# Patient Record
Sex: Female | Born: 1980 | Race: Black or African American | Hispanic: No | Marital: Single | Smoking: Former smoker
Health system: Southern US, Community
[De-identification: ages and names within clinical notes are randomized; demographics above are authoritative.]

## PROBLEM LIST (undated history)

## (undated) DIAGNOSIS — N83209 Unspecified ovarian cyst, unspecified side: Secondary | ICD-10-CM

## (undated) HISTORY — PX: BREAST SURGERY: SHX581

## (undated) HISTORY — PX: KNEE ARTHROSCOPY WITH ANTERIOR CRUCIATE LIGAMENT (ACL) REPAIR: SHX5644

## (undated) HISTORY — PX: BREAST CYST EXCISION: SHX579

## (undated) HISTORY — DX: Unspecified ovarian cyst, unspecified side: N83.209

## (undated) HISTORY — PX: OOPHORECTOMY: SHX86

---

## 2001-05-11 ENCOUNTER — Other Ambulatory Visit: Admission: RE | Admit: 2001-05-11 | Discharge: 2001-05-11 | Payer: Self-pay | Admitting: Obstetrics and Gynecology

## 2001-12-28 ENCOUNTER — Inpatient Hospital Stay (HOSPITAL_COMMUNITY): Admission: AD | Admit: 2001-12-28 | Discharge: 2001-12-30 | Payer: Self-pay | Admitting: Obstetrics and Gynecology

## 2002-09-21 ENCOUNTER — Emergency Department (HOSPITAL_COMMUNITY): Admission: EM | Admit: 2002-09-21 | Discharge: 2002-09-21 | Payer: Self-pay | Admitting: Emergency Medicine

## 2002-09-22 ENCOUNTER — Emergency Department (HOSPITAL_COMMUNITY): Admission: EM | Admit: 2002-09-22 | Discharge: 2002-09-22 | Payer: Self-pay | Admitting: Internal Medicine

## 2002-09-27 ENCOUNTER — Emergency Department (HOSPITAL_COMMUNITY): Admission: EM | Admit: 2002-09-27 | Discharge: 2002-09-27 | Payer: Self-pay | Admitting: Emergency Medicine

## 2006-04-04 ENCOUNTER — Emergency Department (HOSPITAL_COMMUNITY): Admission: EM | Admit: 2006-04-04 | Discharge: 2006-04-04 | Payer: Self-pay | Admitting: Emergency Medicine

## 2006-09-01 ENCOUNTER — Inpatient Hospital Stay (HOSPITAL_COMMUNITY): Admission: AD | Admit: 2006-09-01 | Discharge: 2006-09-01 | Payer: Self-pay | Admitting: Family Medicine

## 2007-05-08 ENCOUNTER — Ambulatory Visit (HOSPITAL_COMMUNITY): Admission: RE | Admit: 2007-05-08 | Discharge: 2007-05-08 | Payer: Self-pay | Admitting: Obstetrics and Gynecology

## 2007-05-08 ENCOUNTER — Encounter (INDEPENDENT_AMBULATORY_CARE_PROVIDER_SITE_OTHER): Payer: Self-pay | Admitting: Obstetrics and Gynecology

## 2007-07-01 ENCOUNTER — Emergency Department (HOSPITAL_COMMUNITY): Admission: EM | Admit: 2007-07-01 | Discharge: 2007-07-01 | Payer: Self-pay | Admitting: Emergency Medicine

## 2007-09-28 ENCOUNTER — Ambulatory Visit (HOSPITAL_COMMUNITY): Admission: RE | Admit: 2007-09-28 | Discharge: 2007-09-28 | Payer: Self-pay | Admitting: Family Medicine

## 2007-11-28 ENCOUNTER — Other Ambulatory Visit: Admission: RE | Admit: 2007-11-28 | Discharge: 2007-11-28 | Payer: Self-pay | Admitting: Obstetrics and Gynecology

## 2008-04-25 ENCOUNTER — Inpatient Hospital Stay (HOSPITAL_COMMUNITY): Admission: AD | Admit: 2008-04-25 | Discharge: 2008-04-26 | Payer: Self-pay | Admitting: Obstetrics & Gynecology

## 2008-04-25 ENCOUNTER — Ambulatory Visit: Payer: Self-pay | Admitting: Family Medicine

## 2010-02-01 ENCOUNTER — Emergency Department (HOSPITAL_COMMUNITY): Admission: EM | Admit: 2010-02-01 | Discharge: 2010-02-01 | Payer: Self-pay | Admitting: Emergency Medicine

## 2010-07-22 LAB — STREP A DNA PROBE: Group A Strep Probe: NEGATIVE

## 2010-07-22 LAB — RAPID STREP SCREEN (MED CTR MEBANE ONLY): Streptococcus, Group A Screen (Direct): NEGATIVE

## 2010-09-21 NOTE — Op Note (Signed)
Vanessa Stevens, Vanessa Stevens             ACCOUNT NO.:  [id number]   MEDICAL RECORD NO.:  [id number]          PATIENT TYPE:  AMB   LOCATION:  SDC                           FACILITY:  [facility]   PHYSICIAN:  Lenoard Aden, M.D.DATE OF BIRTH:  [birth year]   DATE OF PROCEDURE:  05/08/2007  DATE OF DISCHARGE:                               OPERATIVE REPORT   PREOPERATIVE DIAGNOSIS:  Left ovarian cyst.   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   PROCEDURE:  1. Diagnostic laparoscopy.  2. Left salpingo-oophorectomy.   SURGEON:  Dr. Rupert Stacks   ASSISTANT:  Sirven   ANESTHESIA:  General.   ESTIMATED BLOOD LOSS:  Less than 50 mL.   COMPLICATIONS:  None.   DRAINS:  Foley.   COUNTS:  Counts correct.   DISPOSITION:  Patient to recovery in good condition.   PROCEDURE:  After being apprised of risks of anesthesia, infection,  bleeding, need for repair, delayed risks of complications to include  bowel and bladder injury, the patient was brought to the operating room  where she was administered a general anesthetic without complications,  prepped and draped in the usual sterile fashion.  Foley catheter placed.  Hulka tenaculum placed per vagina.  Infraumbilical incision made with a  scalpel.  Fascia was identified and opened transversely, a pursestring  suture placed.  Peritoneum entered bluntly.  Hasson trocar placed.  Visualization reveals a large left ovarian mass that was approximately 8  cm in size.  No excrescences noted. At this time, a 5-mm trocar site in  the left lower quadrant under direct visualization and a 10-mm on the  right under direct visualization.  Instruments were placed.  Infundibulopelvic ligament was identified on the left. Ureter was  identified, and the infundibulopelvic ligament was cauterized, and the  specimen is detached taking progressive bites down the mesosalpinx  including the left tube and ovary, with good hemostasis noted.   At this  time we attempted to place the large specimen in an EndoCatch which was  unsuccessful due to the size of the mass.  Therefore, a new shot is  entered with monopolar cautery.  A hole was created revealing copious  amounts of sebaceous type material and hair which was suctioned  aggressively using a large suction and the shocks. At this time, the  cyst, being decompressed, is then placed in an EndoCatch and brought up  to the surface whereby additional hair and sebaceous material was  removed, decompressing it and pulling it out through the infraumbilical  port without difficulty.  Specimen was then sent to Pathology for  permanent section.  Irrigation was accomplished copiously with three  liters of fluid placed for a copious amount of irrigation without  difficulty.  Good hemostasis was noted.  Pictures were taken.  Normal  right tube and ovary are confirmed and normal uterus.  The area of left  salpingo-oophorectomy which has been removed appears hemostatic.  Irrigation fluid was suctioned aggressively, and good hemostasis was  noted.  No evidence of remaining sebaceous material and hair is noted at  this time. All incisions  were removed under direct visualization, CO2  having been released.  Incisions were closed using 4-0 Vicryl, 0-Vicryl  and Dermabond.  The patient tolerated this procedure well, transferred  to recovery in good condition.      Lenoard Aden, M.D.  Electronically Signed     RJT/MEDQ  D:  05/08/2007  T:  05/09/2007  Job:  [id number]

## 2010-09-24 NOTE — H&P (Signed)
   NAME:  Vanessa Stevens, Vanessa Stevens                       ACCOUNT NO.:  [id number]   MEDICAL RECORD NO.:  [id number]                   PATIENT TYPE:  INP   LOCATION:  A428                                 FACILITY:  [facility]   PHYSICIAN:  [name], CNM       DATE OF BIRTH:  November 03, 1980   DATE OF ADMISSION:  12/28/2001  DATE OF DISCHARGE:                                HISTORY & PHYSICAL   CHIEF COMPLAINT:  Regular uterine contractions since 0900.   HISTORY OF PRESENT ILLNESS:  The patient is a 30 year old gravida 1, para 0  with an EDC of January 01, 2002 based on a sure last menstrual period and  corresponding first and second trimester ultrasounds.  She began her  prenatal care early in the first trimester and has had regular visits  throughout.   PRENATAL LABORATORIES:  Blood type B+.  Rubella immune.  HBSAG, HIV, RPR,  gonorrhea, Chlamydia, group beta Strep, and MSAFP are all negative.  Although her prenatal records at the hospital states sickle positive, when  reviewing her chart at the office it is actually negative with a negative  hemoglobin electrophoresis.  One hour GTT was normal at 106.  Blood  pressures have been 110s-120s/60s-80s.  There has been appropriate fundal  height growth.   PAST MEDICAL HISTORY:  Noncontributory.   PAST SURGICAL HISTORY:  Torn ACL in 1999, breast biopsy 2001 that was  benign.  No history of blood transfusions or anesthesia complications.   ALLERGIES:  No known drug allergies.   MEDICATIONS:  Prenatal vitamins and iron.   SOCIAL HISTORY:  Denies cigarettes, alcohol, or recreational drug use during  pregnancy.  Drug screens were negative.   FAMILY HISTORY:  Noncontributory.   PAST OBSTETRIC HISTORY:  Gravida 1.   PHYSICAL EXAMINATION:  HEENT:  Within normal limits.  HEART:  Regular rate and rhythm.  LUNGS:  Clear to auscultation.  ABDOMEN:  Soft, nontender.  Having moderate to palpation uterine  contractions every two to three  minutes.  PELVIC:  Cervix is now 5, 100%, -1 station, vertex presentation which is a  change from initial examination on OB of 2, 90, -1.  EXTREMITIES:  Leg:  Trace edema.   IMPRESSION:  Intrauterine pregnancy at 39-1/2 weeks, active labor.   PLAN:  AROM reveals clear fluid.  Fetal heart rate is reactive without  decelerations.  The patient is tolerating labor very well at this time.  Will perhaps proceed to IV analgesia or epidural anesthesia as necessary.                                               [name], CNM    FC/MEDQ  D:  12/28/2001  T:  12/28/2001  Job:  [id number]   cc:   [facility]

## 2010-09-24 NOTE — Op Note (Signed)
   NAME:  DIAHANN, GUAJARDO                       ACCOUNT NO.:  [id number]   MEDICAL RECORD NO.:  [id number]                   PATIENT TYPE:  INP   LOCATION:  A428                                 FACILITY:  [facility]   PHYSICIAN:  Tilda Burrow, M.D.              DATE OF BIRTH:  1981/02/25   DATE OF PROCEDURE:  DATE OF DISCHARGE:                                 OPERATIVE REPORT   PROCEDURE:  Epidural catheter placement.   SURGEON:  Tilda Burrow, M.D.   DESCRIPTION OF PROCEDURE:  The patient requested an epidural and was given a  fluid bolus shortly before 8 p.m.  At 8 p.m. a continuous lumbar epidural  was instilled through L2-3 interspace on first attempt using loss of  resistance technique and easily identifying the epidural space at  approximately 6 cm depth.  A 5 cc bolus of 1.5% Xylocaine with epinephrine  was infused followed by insertion of the epidural catheter to 3 cm into the  epidural space with it taped in place.  The 5 cc bolus of 0.125% Marcaine  with 2 mcg/mL of Fentanyl was then infused and 12 cc per hour, continuous  infusion following.  Initial dermatome level was 212. The patient had  excellent symmetric analgesic relief and is now on Pitocin augmentation.                                                Tilda Burrow, M.D.    JVF/MEDQ  D:  12/28/2001  T:  12/29/2001  Job:  [id number]

## 2010-09-24 NOTE — Op Note (Signed)
NAME:  Vanessa Stevens, Vanessa Stevens                       ACCOUNT NO.:  [id number]   MEDICAL RECORD NO.:  [id number]                   PATIENT TYPE:  INP   LOCATION:  A428                                 FACILITY:  [facility]   PHYSICIAN:  Tilda Burrow, M.D.              DATE OF BIRTH:  [birth year]   DATE OF PROCEDURE:  12/28/2001  DATE OF DISCHARGE:                                 OPERATIVE REPORT   LABOR SUMMARY DELIVERY NOTE:   DESCRIPTION OF PROCEDURE:  This patient was admitted shortly after midnight,  having as described in the admitting history by [name].  She progressed slowly through the day, tolerating labor well, and reached  approximately 5 cm at midday.  She was begun on Pitocin augmentation at  approximately 4 a.m., when she had slowed in per progress.  The protracted  labor was improved by the use of Pitocin.   The cervix was 6 cm, 100%, minus 1 at 5:10 p.m. I assumed primary  responsibility, thereafter, and she progressed to approximately 7 cm, 100%  effaced, minus 1 to minus 2 station at the time of epidural placement at 8  p.m.   EPIDURAL PLACEMENT:  Epidural was placed using loss of resistance technique  as described in the epidural catheter note dictated earlier this evening.   We then proceeded with Pitocin augmentation and she progressed slowly to  completely dilated at shortly after 11 p.m.  She pushed through a brief  second-stage that was notable for repetitive variable decelerations which  got worse.  They were actually occurring late in a late position on the  monitoring strip, but were accompanied by excellent beat-to-beat variability  and shoulders consistent with variables.  She progressed to the spontaneous  expulsion over an intact perineum from the direct OA position of a healthy 7-  pound 13.8 ounce infant, female, Apgars 8 and 9 delivered over a deep,  fragmented, second-degree laceration.   The delivery was straightforward.  There was  nuchal arm x1 and the infant's  posterior arm was released initially and then the anterior shoulder  delivered easily thereafter.  Bulb suctioning was performed of the nose and  pharynx and then the patient progressed to expel the rest of the baby the  rest of the way with ease.  She then proceeded to have cord blood samples  obtained, Tomasa Blase presentation delivery of the placenta, which was grossly  normal, 3-vessel cord confirmed.   The repair of the perineum was somewhat challenging due to the 2-sided  laceration which left an intact band of tissue right over the perineal body.  A series of horizontal mattress sutures reaching from just beneath the  intact epithelium on each side, extending down underneath the central band  of intact perineum and up to the other side where a second horizontal  mattress was used.  This allowed for reasonable tissue approximation.  The  patient had  only epidural  analgesia for this and had some sensation at the time of  repair.  Epidural catheter was then removed and the tip visualized as  intact.   The patient tolerated the procedure well, and was well supported by three  family members.                                               Tilda Burrow, M.D.    JVF/MEDQ  D:  12/28/2001  T:  12/30/2001  Job:  [id number]

## 2011-02-02 LAB — HCG, QUANTITATIVE, PREGNANCY: hCG, Beta Chain, Quant, S: 45343 — ABNORMAL HIGH

## 2011-02-11 LAB — CBC
Hemoglobin: 9.9 g/dL — ABNORMAL LOW (ref 12.0–15.0)
Hemoglobin: 12.4
Platelets: 225 10*3/uL (ref 150–400)
Platelets: 252
RDW: 14.9 % (ref 11.5–15.5)
RDW: 14

## 2011-02-11 LAB — HEPATITIS B SURFACE ANTIGEN: Hepatitis B Surface Ag: NEGATIVE

## 2011-02-11 LAB — HEPATITIS B SURFACE ANTIBODY,QUALITATIVE: Hep B S Ab: POSITIVE — AB

## 2011-02-11 LAB — HCG, SERUM, QUALITATIVE: Preg, Serum: NEGATIVE

## 2011-02-11 LAB — RPR: RPR Ser Ql: NONREACTIVE

## 2012-11-15 ENCOUNTER — Emergency Department (HOSPITAL_COMMUNITY)
Admission: EM | Admit: 2012-11-15 | Discharge: 2012-11-15 | Disposition: A | Discharge status: Home / Self Care | Payer: Self-pay | Attending: Emergency Medicine | Admitting: Emergency Medicine

## 2012-11-15 ENCOUNTER — Emergency Department (HOSPITAL_COMMUNITY): Payer: Self-pay

## 2012-11-15 ENCOUNTER — Encounter (HOSPITAL_COMMUNITY): Payer: Self-pay | Admitting: Emergency Medicine

## 2012-11-15 DIAGNOSIS — K921 Melena: Secondary | ICD-10-CM | POA: Insufficient documentation

## 2012-11-15 DIAGNOSIS — R112 Nausea with vomiting, unspecified: Secondary | ICD-10-CM | POA: Insufficient documentation

## 2012-11-15 DIAGNOSIS — R197 Diarrhea, unspecified: Secondary | ICD-10-CM | POA: Insufficient documentation

## 2012-11-15 DIAGNOSIS — K5289 Other specified noninfective gastroenteritis and colitis: Secondary | ICD-10-CM | POA: Insufficient documentation

## 2012-11-15 DIAGNOSIS — R6883 Chills (without fever): Secondary | ICD-10-CM | POA: Insufficient documentation

## 2012-11-15 DIAGNOSIS — F17200 Nicotine dependence, unspecified, uncomplicated: Secondary | ICD-10-CM | POA: Insufficient documentation

## 2012-11-15 DIAGNOSIS — Z3202 Encounter for pregnancy test, result negative: Secondary | ICD-10-CM | POA: Insufficient documentation

## 2012-11-15 DIAGNOSIS — K529 Noninfective gastroenteritis and colitis, unspecified: Secondary | ICD-10-CM

## 2012-11-15 LAB — BASIC METABOLIC PANEL
CO2: 27 mEq/L (ref 19–32)
Glucose, Bld: 110 mg/dL — ABNORMAL HIGH (ref 70–99)
Potassium: 3.7 mEq/L (ref 3.5–5.1)
Sodium: 138 mEq/L (ref 135–145)

## 2012-11-15 LAB — CBC WITH DIFFERENTIAL/PLATELET
Basophils Absolute: 0 10*3/uL (ref 0.0–0.1)
Lymphocytes Relative: 11 % — ABNORMAL LOW (ref 12–46)
Lymphs Abs: 1 10*3/uL (ref 0.7–4.0)
Neutro Abs: 7.7 10*3/uL (ref 1.7–7.7)
Neutrophils Relative %: 83 % — ABNORMAL HIGH (ref 43–77)
Platelets: 240 10*3/uL (ref 150–400)
RBC: 4.27 MIL/uL (ref 3.87–5.11)
WBC: 9.2 10*3/uL (ref 4.0–10.5)

## 2012-11-15 LAB — URINE MICROSCOPIC-ADD ON

## 2012-11-15 LAB — HEPATIC FUNCTION PANEL
ALT: 12 U/L (ref 0–35)
Bilirubin, Direct: <0.1 mg/dL (ref 0.0–0.3)
Total Protein: 8 g/dL (ref 6.0–8.3)

## 2012-11-15 LAB — URINALYSIS, ROUTINE W REFLEX MICROSCOPIC
Glucose, UA: NEGATIVE mg/dL
Leukocytes, UA: NEGATIVE
Specific Gravity, Urine: >1.03 — ABNORMAL HIGH (ref 1.005–1.030)
Urobilinogen, UA: 0.2 mg/dL (ref 0.0–1.0)

## 2012-11-15 LAB — PREGNANCY, URINE: Preg Test, Ur: NEGATIVE

## 2012-11-15 MED ORDER — LOPERAMIDE HCL 2 MG PO TABS: 2.0000 mg | ORAL_TABLET | Freq: Four times a day (QID) | ORAL | Status: DC | PRN

## 2012-11-15 MED ORDER — IOHEXOL 300 MG/ML  SOLN
50.0000 mL | Freq: Once | INTRAMUSCULAR | Status: AC | PRN
  Administered 2012-11-15: 50 mL via ORAL

## 2012-11-15 MED ORDER — HYDROMORPHONE HCL PF 1 MG/ML IJ SOLN
1.0000 mg | Freq: Once | INTRAMUSCULAR | Status: AC
  Administered 2012-11-15: 1 mg via INTRAVENOUS
  Filled 2012-11-15: qty 1

## 2012-11-15 MED ORDER — SODIUM CHLORIDE 0.9 % IV SOLN
INTRAVENOUS | Status: DC
  Administered 2012-11-15: 12:00:00 via INTRAVENOUS

## 2012-11-15 MED ORDER — HYDROCODONE-ACETAMINOPHEN 5-325 MG PO TABS: 1.0000 | ORAL_TABLET | Freq: Four times a day (QID) | ORAL | Status: DC | PRN

## 2012-11-15 MED ORDER — ONDANSETRON HCL 4 MG/2ML IJ SOLN
4.0000 mg | Freq: Once | INTRAMUSCULAR | Status: AC
  Administered 2012-11-15: 4 mg via INTRAVENOUS
  Filled 2012-11-15: qty 2

## 2012-11-15 MED ORDER — SODIUM CHLORIDE 0.9 % IV BOLUS (SEPSIS)
1000.0000 mL | Freq: Once | INTRAVENOUS | Status: AC
  Administered 2012-11-15: 1000 mL via INTRAVENOUS

## 2012-11-15 MED ORDER — IOHEXOL 300 MG/ML  SOLN
100.0000 mL | Freq: Once | INTRAMUSCULAR | Status: AC | PRN
  Administered 2012-11-15: 100 mL via INTRAVENOUS

## 2012-11-15 MED ORDER — ONDANSETRON 4 MG PO TBDP: 4.0000 mg | ORAL_TABLET | Freq: Three times a day (TID) | ORAL | Status: DC | PRN

## 2012-11-15 MED ORDER — PROMETHAZINE HCL 25 MG PO TABS: 25.0000 mg | ORAL_TABLET | Freq: Four times a day (QID) | ORAL | Status: DC | PRN

## 2012-11-15 NOTE — ED Provider Notes (Signed)
History  This chart was scribed for Vanessa Jakes, MD by Bennett Scrape, ED Scribe. This patient was seen in room APA09/APA09 and the patient's care was started at 10:01 AM.  CSN: [id number]  Arrival date & time 11/15/12  0908   First MD Initiated Contact with Patient 11/15/12 1001     Chief Complaint  Patient presents with  . Abdominal Pain    Patient is a 32 y.o. female presenting with abdominal pain. The history is provided by the patient. No language interpreter was used.  Abdominal Pain This is a new problem. The current episode started 2 days ago. Episode frequency: intermittently. The problem has not changed since onset.Associated symptoms include abdominal pain. Pertinent negatives include no chest pain, no headaches and no shortness of breath. Nothing aggravates the symptoms. Nothing relieves the symptoms. She has tried nothing for the symptoms.    HPI Comments: Vanessa Stevens is a 32 y.o. female who presents to the Emergency Department complaining of 2 days of intermittent periumbilical abdominal pain described as cramping with associated nausea, emesis and diarrhea. She reports 3 episodes of emesis today with no episodes occuring yesterday or 2 days ago. She states that she has experienced 4 episodes of diarrhea today usually with emesis and more than 4 episodes yesterday. She states that the pain radiates to her back and she rates her pain an 8 out of 10. She reports that the symptoms are similar to pain experienced with her menses but is more severe and in a different location. She states that she is due to start her menses soon stating LNMP was June 15th, 2014. She denies vaginal bleeding or discharge, CP, dysuria, hematuria and leg swelling as associated symptoms. Pt does not have a h/o chronic medical conditions and is a current everyday smoker but denies alcohol use.Marland Kitchen  History reviewed. No pertinent past medical history.  Past Surgical History  Procedure Laterality  Date  . Oophorectomy    . Breast surgery    . Knee arthroscopy with anterior cruciate ligament (acl) repair     No family history on file. History  Substance Use Topics  . Smoking status: Current Every Day Smoker -- 0.50 packs/day    Types: Cigarettes  . Smokeless tobacco: Not on file  . Alcohol Use: No   No OB history provided.  Review of Systems  Constitutional: Positive for chills. Negative for fever.  HENT: Negative for congestion, sore throat and neck pain.   Eyes: Negative for visual disturbance.  Respiratory: Negative for cough and shortness of breath.   Cardiovascular: Negative for chest pain and leg swelling.  Gastrointestinal: Positive for nausea, vomiting, abdominal pain, diarrhea and blood in stool.  Genitourinary: Negative for dysuria.  Musculoskeletal: Negative for back pain.  Skin: Negative for rash.  Neurological: Negative for headaches.  Hematological: Does not bruise/bleed easily.  Psychiatric/Behavioral: Negative for confusion.    Allergies  Review of patient's allergies indicates no known allergies.  Home Medications   Current Outpatient Rx  Name  Route  Sig  Dispense  Refill  . HYDROcodone-acetaminophen (NORCO/VICODIN) 5-325 MG per tablet   Oral   Take 1-2 tablets by mouth every 6 (six) hours as needed for pain.   15 tablet   0   . loperamide (IMODIUM A-D) 2 MG tablet   Oral   Take 1 tablet (2 mg total) by mouth 4 (four) times daily as needed for diarrhea or loose stools.   30 tablet   0   .  ondansetron (ZOFRAN ODT) 4 MG disintegrating tablet   Oral   Take 1 tablet (4 mg total) by mouth every 8 (eight) hours as needed.   10 tablet   0   . promethazine (PHENERGAN) 25 MG tablet   Oral   Take 1 tablet (25 mg total) by mouth every 6 (six) hours as needed for nausea.   12 tablet   0     Triage Vitals: BP 128/80  Pulse 87  Temp(Src) 98.1 F (36.7 C)  Resp 18  Ht 5\' 3"  (1.6 m)  Wt 190 lb (86.183 kg)  BMI 33.67 kg/m2  SpO2 98%  LMP  10/21/2012  Physical Exam  Nursing note and vitals reviewed. Constitutional: She is oriented to person, place, and time. She appears well-developed and well-nourished. No distress.  HENT:  Head: Normocephalic and atraumatic.  Mouth/Throat: Oropharynx is clear and moist.  Eyes: Conjunctivae and EOM are normal. Pupils are equal, round, and reactive to light.  Sclera are clear  Neck: Neck supple. No tracheal deviation present.  Cardiovascular: Normal rate and regular rhythm.   No murmur heard. Pulses:      Dorsalis pedis pulses are 2+ on the right side, and 2+ on the left side.  Pulmonary/Chest: Effort normal and breath sounds normal. No respiratory distress. She has no wheezes.  Abdominal: Soft. She exhibits no distension. Bowel sounds are decreased. There is tenderness in the epigastric area and periumbilical area.  Musculoskeletal: Normal range of motion. She exhibits no edema (no ankle swelling).  Lymphadenopathy:    She has no cervical adenopathy.  Neurological: She is alert and oriented to person, place, and time. No cranial nerve deficit.  Pt able to move both sets of fingers and toes  Skin: Skin is warm and dry. No rash noted.  Psychiatric: She has a normal mood and affect. Her behavior is normal.    ED Course  Procedures (including critical care time)  DIAGNOSTIC STUDIES: Oxygen Saturation is 98% on room air, normal by my interpretation.    COORDINATION OF CARE: 10:56 AM-Discussed treatment plan which includes CT of abdomen, CBC panel, BMP and UA with pt at bedside and pt agreed to plan.   Labs Reviewed  CBC WITH DIFFERENTIAL - Abnormal; Notable for the following:    Neutrophils Relative % 83 (*)    Lymphocytes Relative 11 (*)    All other components within normal limits  BASIC METABOLIC PANEL - Abnormal; Notable for the following:    Glucose, Bld 110 (*)    All other components within normal limits  URINALYSIS, ROUTINE W REFLEX MICROSCOPIC - Abnormal; Notable for the  following:    Specific Gravity, Urine >1.030 (*)    Hgb urine dipstick SMALL (*)    Ketones, ur 40 (*)    All other components within normal limits  URINE MICROSCOPIC-ADD ON - Abnormal; Notable for the following:    Squamous Epithelial / LPF MANY (*)    Bacteria, UA MANY (*)    All other components within normal limits  PREGNANCY, URINE  HEPATIC FUNCTION PANEL  LIPASE, BLOOD   Ct Abdomen Pelvis W Contrast  11/15/2012   *RADIOLOGY REPORT*  Clinical Data: Periumbilical abdominal pain for 2 days, nausea, vomiting, diarrhea, pain radiating to back, history smoking  CT ABDOMEN AND PELVIS WITH CONTRAST  Technique:  Multidetector CT imaging of the abdomen and pelvis was performed following the standard protocol during bolus administration of intravenous contrast. Sagittal and coronal MPR images reconstructed from axial data set.  Contrast: 50mL OMNIPAQUE IOHEXOL 300 MG/ML  SOLN, OMNIPAQUE IOHEXOL 300 MG/ML  SOLN  Comparison: None.  Findings: Lung bases clear. Five non-rib bearing lumbar vertebrae. Upper normal-sized appendix without discrete periappendiceal inflammatory changes. Diffuse wall thickening of colon throughout its length with mild pericolic edema consistent with diffuse colitis. No evidence of perforation or abscess.  Number of normal and upper normal sized retroperitoneal and mesenteric lymph nodes without adenopathy. Low attenuation nonspecific free pelvic fluid. Unremarkable bladder, uterus and ovaries. Stomach and small bowel loops normal appearance. No mass, hernia, free air or acute osseous findings.  IMPRESSION: Diffuse colitis likely either due to infection or inflammatory bowel disease. Numerous normal and upper normal-sized mesenteric lymph nodes, question reactive. Nonspecific free pelvic fluid.   Original Report Authenticated By: Ulyses Southward, M.D.    Results for orders placed during the hospital encounter of 11/15/12  CBC WITH DIFFERENTIAL      Result Value Range   WBC 9.2   4.0 - 10.5 K/uL   RBC 4.27  3.87 - 5.11 MIL/uL   Hemoglobin 13.2  12.0 - 15.0 g/dL   HCT 16.1  09.6 - 04.5 %   MCV 90.4  78.0 - 100.0 fL   MCH 30.9  26.0 - 34.0 pg   MCHC 34.2  30.0 - 36.0 g/dL   RDW 40.9  81.1 - 91.4 %   Platelets 240  150 - 400 K/uL   Neutrophils Relative % 83 (*) 43 - 77 %   Neutro Abs 7.7  1.7 - 7.7 K/uL   Lymphocytes Relative 11 (*) 12 - 46 %   Lymphs Abs 1.0  0.7 - 4.0 K/uL   Monocytes Relative 5  3 - 12 %   Monocytes Absolute 0.4  0.1 - 1.0 K/uL   Eosinophils Relative 1  0 - 5 %   Eosinophils Absolute 0.1  0.0 - 0.7 K/uL   Basophils Relative 0  0 - 1 %   Basophils Absolute 0.0  0.0 - 0.1 K/uL  BASIC METABOLIC PANEL      Result Value Range   Sodium 138  135 - 145 mEq/L   Potassium 3.7  3.5 - 5.1 mEq/L   Chloride 102  96 - 112 mEq/L   CO2 27  19 - 32 mEq/L   Glucose, Bld 110 (*) 70 - 99 mg/dL   BUN 13  6 - 23 mg/dL   Creatinine, Ser 7.82  0.50 - 1.10 mg/dL   Calcium 9.4  8.4 - 95.6 mg/dL   GFR calc non Af Amer >90  >90 mL/min   GFR calc Af Amer >90  >90 mL/min  URINALYSIS, ROUTINE W REFLEX MICROSCOPIC      Result Value Range   Color, Urine YELLOW  YELLOW   APPearance CLEAR  CLEAR   Specific Gravity, Urine >1.030 (*) 1.005 - 1.030   pH 6.0  5.0 - 8.0   Glucose, UA NEGATIVE  NEGATIVE mg/dL   Hgb urine dipstick SMALL (*) NEGATIVE   Bilirubin Urine NEGATIVE  NEGATIVE   Ketones, ur 40 (*) NEGATIVE mg/dL   Protein, ur NEGATIVE  NEGATIVE mg/dL   Urobilinogen, UA 0.2  0.0 - 1.0 mg/dL   Nitrite NEGATIVE  NEGATIVE   Leukocytes, UA NEGATIVE  NEGATIVE  PREGNANCY, URINE      Result Value Range   Preg Test, Ur NEGATIVE  NEGATIVE  HEPATIC FUNCTION PANEL      Result Value Range   Total Protein 8.0  6.0 - 8.3 g/dL  Albumin 4.0  3.5 - 5.2 g/dL   AST 18  0 - 37 U/L   ALT 12  0 - 35 U/L   Alkaline Phosphatase 82  39 - 117 U/L   Total Bilirubin 0.4  0.3 - 1.2 mg/dL   Bilirubin, Direct <1.6  0.0 - 0.3 mg/dL   Indirect Bilirubin NOT CALCULATED  0.3 - 0.9 mg/dL   LIPASE, BLOOD      Result Value Range   Lipase 17  11 - 59 U/L  URINE MICROSCOPIC-ADD ON      Result Value Range   Squamous Epithelial / LPF MANY (*) RARE   WBC, UA 0-2  <3 WBC/hpf   Bacteria, UA MANY (*) RARE   Urine-Other MUCOUS PRESENT        1. Colitis     MDM  CT scans consistent with colitis. The patient without a significant leukocytosis no history of fevers. Patient did give a history of some blood tinging in stool but no staining of the colon were significantly red. Suspect that the this is all consistent with colitis. Does not appear to be bacterial in nature since she's not toxic no leukocytosis no significant abdominal tenderness. Will go ahead and treat as if it could be a viral colitis with bowel rest. Patient knows to return for any new or worse symptoms.    I personally performed the services described in this documentation, which was scribed in my presence. The recorded information has been reviewed and is accurate.     Vanessa Jakes, MD 11/15/12 [phone]

## 2012-11-15 NOTE — ED Notes (Signed)
Pt c/o abd pain/n/v/d x 2 days with some back pain. Denies gu.

## 2014-11-23 IMAGING — CT CT ABD-PELV W/ CM
2 of 4 series · 16 of 46 positions shown, 18 images · IV contrast (Omnipaque 300)
Comparison: None.

CLINICAL DATA: Periumbilical abdominal pain for 2 days, nausea,
vomiting, diarrhea, pain radiating to back, history smoking

CT ABDOMEN AND PELVIS WITH CONTRAST
TECHNIQUE: Multidetector CT imaging of the abdomen and pelvis was
performed following the standard protocol during bolus
administration of intravenous contrast. Sagittal and coronal MPR
images reconstructed from axial data set.
Contrast: 50mL OMNIPAQUE IOHEXOL 300 MG/ML  SOLN, 100mL OMNIPAQUE
IOHEXOL 300 MG/ML  SOLN

[Series 2: abd_pel_with 5.0 b40f · axial · 0.71mm/px · z∈[-454,-8]mm · 13 of 99 slices shown, 15 images]
[im 5/99  soft-tissue]
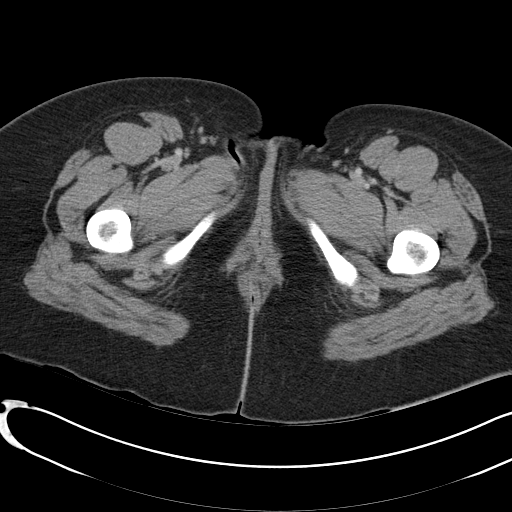
[im 5/99  bone]
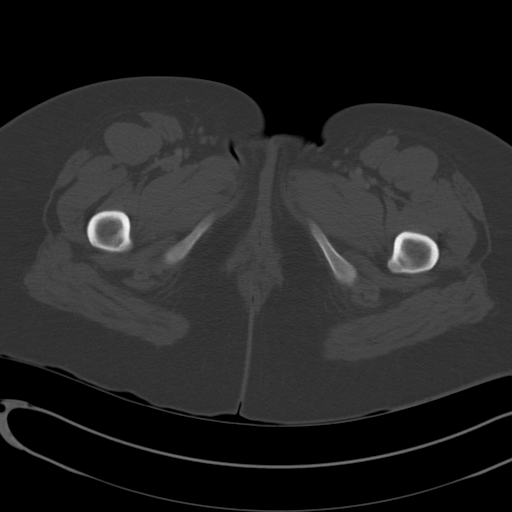
[im 13/99  soft-tissue]
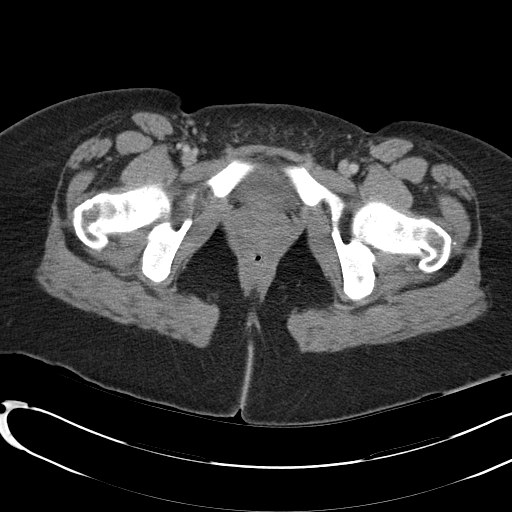
[im 21/99  soft-tissue]
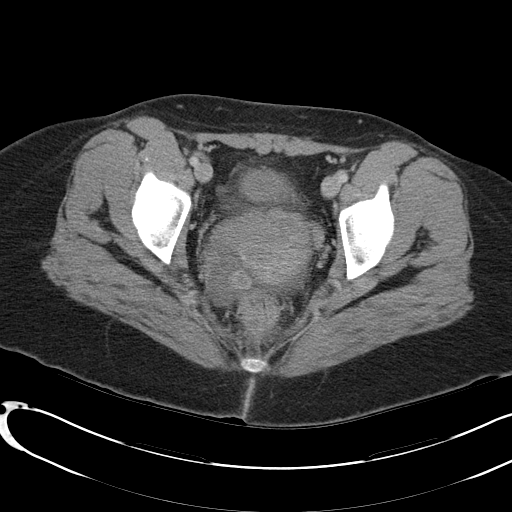
[im 29/99  soft-tissue]
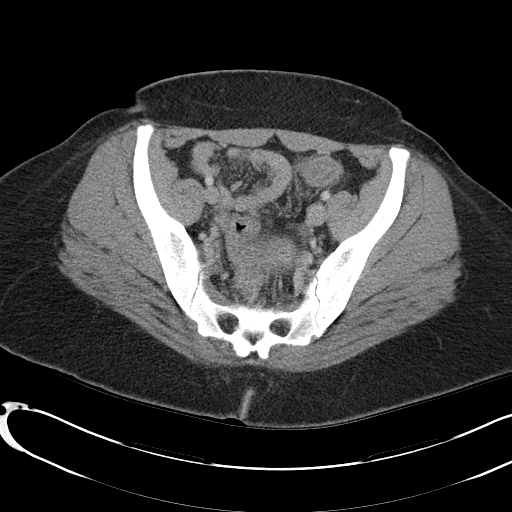
[im 33/99  soft-tissue]
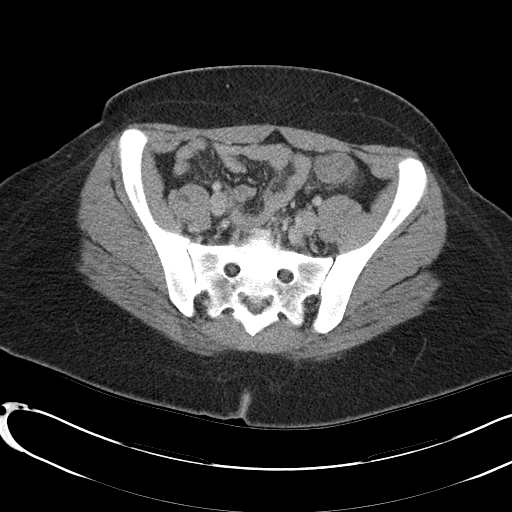
[im 41/99  soft-tissue]
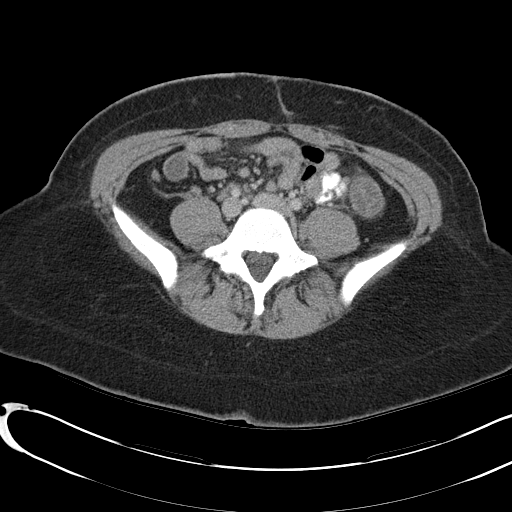
[im 50/99  soft-tissue]
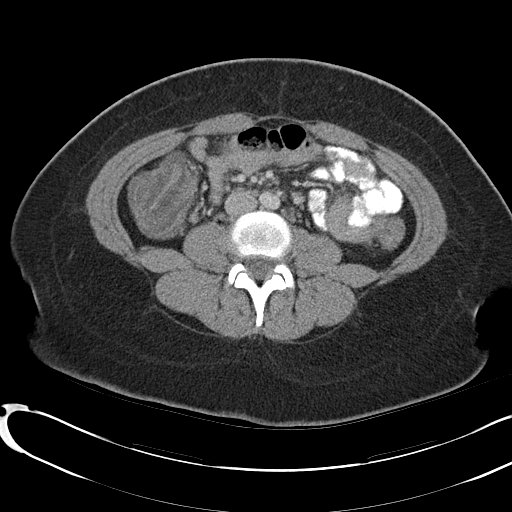
[im 58/99  soft-tissue]
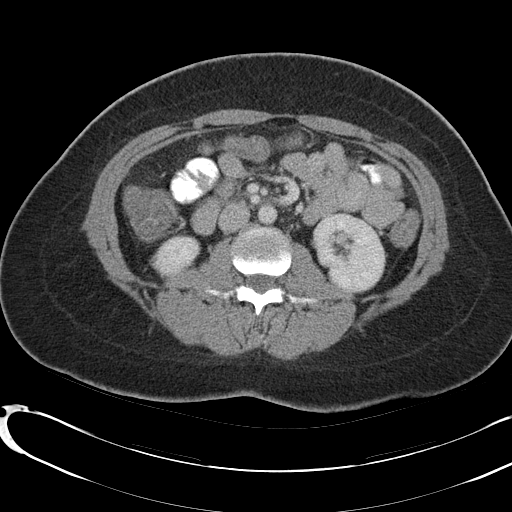
[im 66/99  soft-tissue]
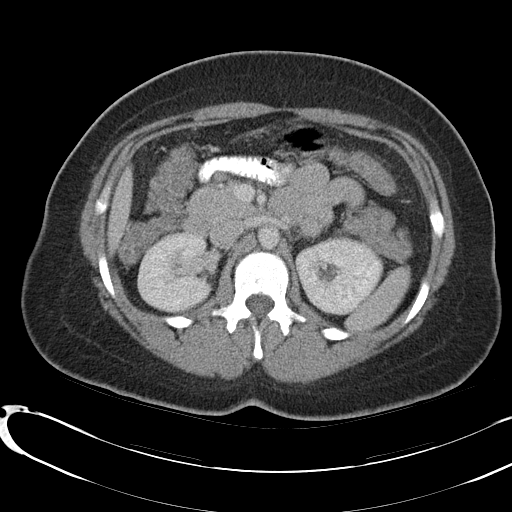
[im 66/99  bone]
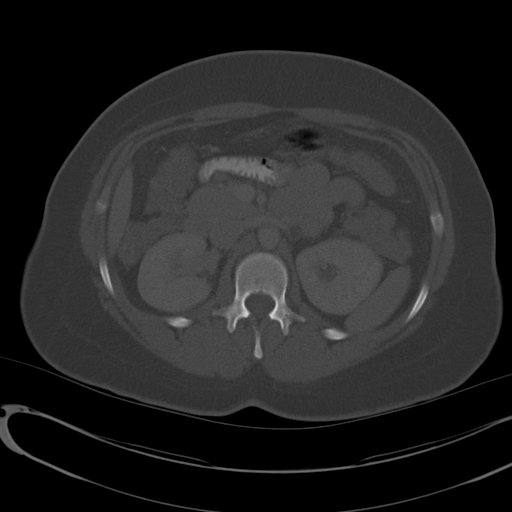
[im 70/99  soft-tissue]
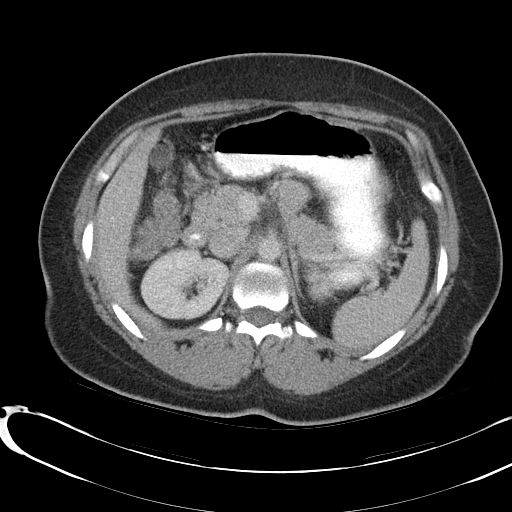
[im 78/99  soft-tissue]
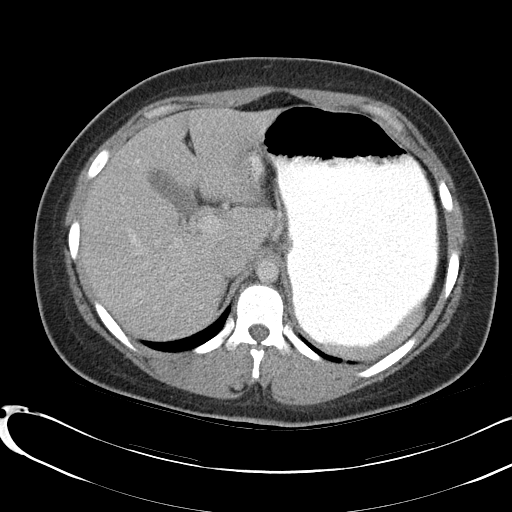
[im 86/99  soft-tissue]
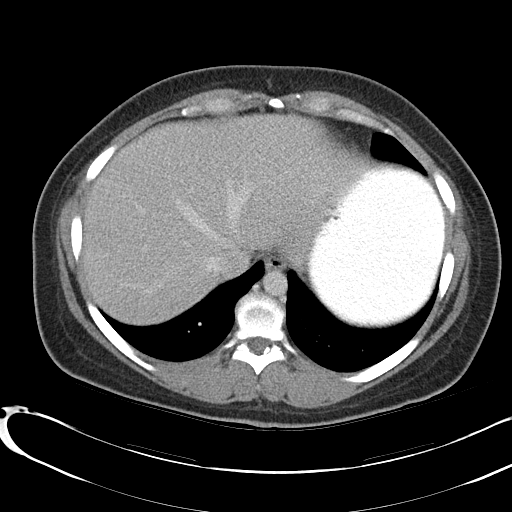
[im 94/99  soft-tissue]
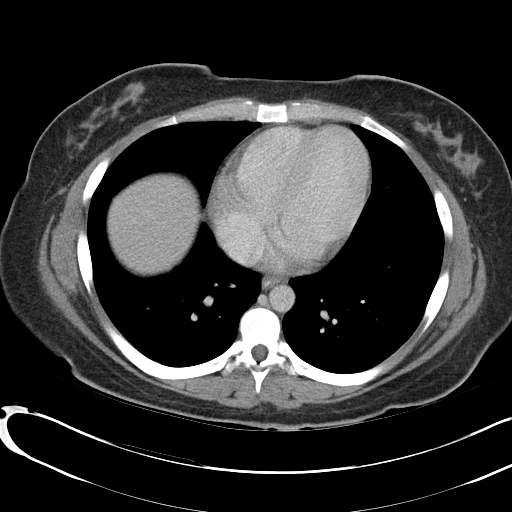

[Series 4: abd_pel_with 3.0 spo · coronal · 0.70mm/px · 3 of 79 slices shown]
[im 27/79  soft-tissue]
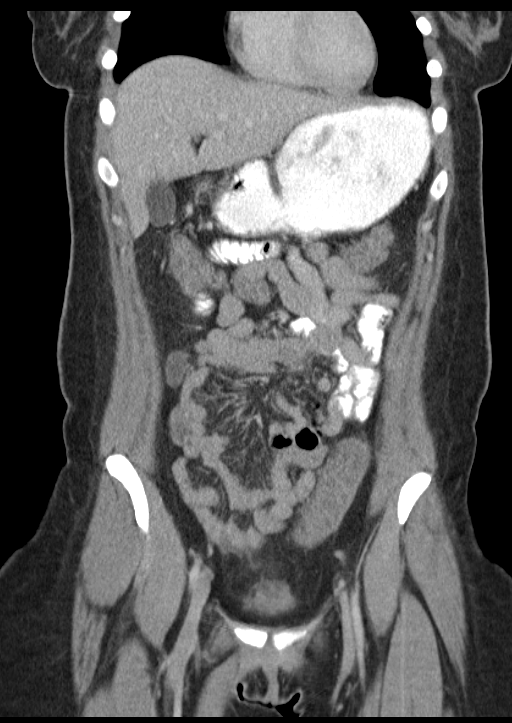
[im 35/79  soft-tissue]
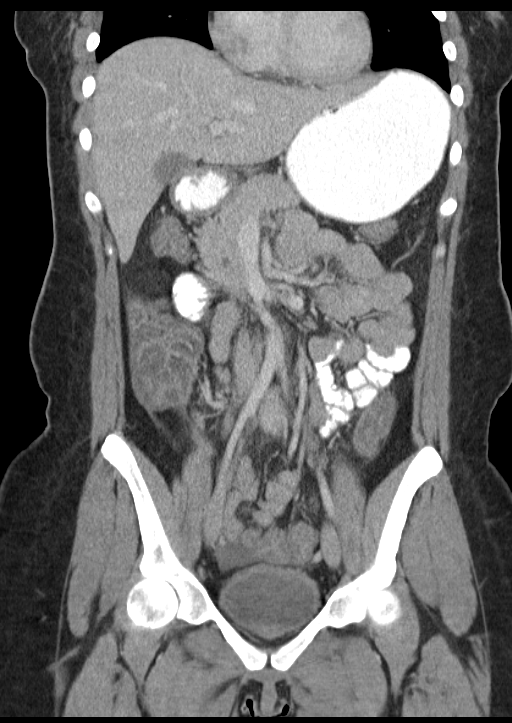
[im 44/79  soft-tissue]
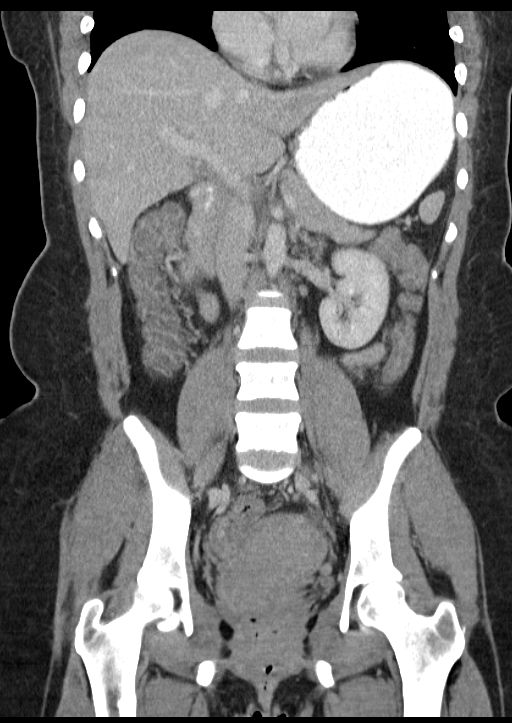

[16 of 46 positions shown; findings below may reference images not displayed]

FINDINGS: Lung bases clear.
Five non-rib bearing lumbar vertebrae.
Upper normal-sized appendix without discrete periappendiceal
inflammatory changes.
Diffuse wall thickening of colon throughout its length with mild
pericolic edema consistent with diffuse colitis.
No evidence of perforation or abscess.

Number of normal and upper normal sized retroperitoneal and
mesenteric lymph nodes without adenopathy.
Low attenuation nonspecific free pelvic fluid.
Unremarkable bladder, uterus and ovaries.
Stomach and small bowel loops normal appearance.
No mass, hernia, free air or acute osseous findings.
IMPRESSION: Diffuse colitis likely either due to infection or inflammatory
bowel disease.
Numerous normal and upper normal-sized mesenteric lymph nodes,
question reactive.
Nonspecific free pelvic fluid.

## 2015-06-29 ENCOUNTER — Ambulatory Visit: Payer: Self-pay | Admitting: Family Medicine

## 2017-06-07 ENCOUNTER — Encounter (HOSPITAL_COMMUNITY): Payer: Self-pay

## 2017-06-07 ENCOUNTER — Emergency Department (HOSPITAL_COMMUNITY)
Admission: EM | Admit: 2017-06-07 | Discharge: 2017-06-07 | Disposition: A | Discharge status: Home / Self Care | Payer: Self-pay | Attending: Emergency Medicine | Admitting: Emergency Medicine

## 2017-06-07 DIAGNOSIS — Y92410 Unspecified street and highway as the place of occurrence of the external cause: Secondary | ICD-10-CM | POA: Insufficient documentation

## 2017-06-07 DIAGNOSIS — Y998 Other external cause status: Secondary | ICD-10-CM | POA: Insufficient documentation

## 2017-06-07 DIAGNOSIS — Y939 Activity, unspecified: Secondary | ICD-10-CM | POA: Insufficient documentation

## 2017-06-07 DIAGNOSIS — F17210 Nicotine dependence, cigarettes, uncomplicated: Secondary | ICD-10-CM | POA: Insufficient documentation

## 2017-06-07 DIAGNOSIS — T148XXA Other injury of unspecified body region, initial encounter: Secondary | ICD-10-CM | POA: Insufficient documentation

## 2017-06-07 MED ORDER — CYCLOBENZAPRINE HCL 10 MG PO TABS: 10.0000 mg | ORAL_TABLET | Freq: Three times a day (TID) | ORAL | 0 refills | Status: DC

## 2017-06-07 MED ORDER — IBUPROFEN 600 MG PO TABS: 600.0000 mg | ORAL_TABLET | Freq: Four times a day (QID) | ORAL | 0 refills | Status: DC
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
## 2017-06-07 NOTE — Discharge Instructions (Signed)
Your examination suggest some muscle spasm involving your mid and upper back area.  Please use a 10-15 minutes soak in a warm Epson salt bath daily.  Please use Flexeril 3 times daily as needed for spasm.  Use ibuprofen with breakfast, lunch, dinner, and at bedtime.  Please see Dr.Luking or return to the emergency department if not improving, or any emergent changes, problems, or concerns.
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
## 2018-10-22 DIAGNOSIS — H52223 Regular astigmatism, bilateral: Secondary | ICD-10-CM | POA: Diagnosis not present

## 2018-10-22 DIAGNOSIS — H5213 Myopia, bilateral: Secondary | ICD-10-CM | POA: Diagnosis not present

## 2018-12-05 DIAGNOSIS — H5213 Myopia, bilateral: Secondary | ICD-10-CM | POA: Diagnosis not present

## 2019-04-10 ENCOUNTER — Other Ambulatory Visit: Payer: Self-pay

## 2019-04-10 ENCOUNTER — Ambulatory Visit: Payer: Medicaid Other | Admitting: Certified Nurse Midwife

## 2019-04-10 ENCOUNTER — Encounter: Payer: Self-pay | Admitting: Certified Nurse Midwife

## 2019-04-10 ENCOUNTER — Other Ambulatory Visit (HOSPITAL_COMMUNITY)
Admission: RE | Admit: 2019-04-10 | Discharge: 2019-04-10 | Disposition: A | Discharge status: Home / Self Care | Payer: Medicaid Other | Source: Ambulatory Visit | Attending: Certified Nurse Midwife | Admitting: Certified Nurse Midwife

## 2019-04-10 VITALS — BP 124/88 | HR 114 | Temp 98.5°F | Ht 63.0 in | Wt 187.2 lb

## 2019-04-10 DIAGNOSIS — Z124 Encounter for screening for malignant neoplasm of cervix: Secondary | ICD-10-CM | POA: Diagnosis not present

## 2019-04-10 DIAGNOSIS — N760 Acute vaginitis: Secondary | ICD-10-CM

## 2019-04-10 DIAGNOSIS — Z0000 Encounter for general adult medical examination without abnormal findings: Secondary | ICD-10-CM

## 2019-04-10 DIAGNOSIS — Z01419 Encounter for gynecological examination (general) (routine) without abnormal findings: Secondary | ICD-10-CM | POA: Insufficient documentation

## 2019-04-10 DIAGNOSIS — Z113 Encounter for screening for infections with a predominantly sexual mode of transmission: Secondary | ICD-10-CM

## 2019-04-10 DIAGNOSIS — B9689 Other specified bacterial agents as the cause of diseases classified elsewhere: Secondary | ICD-10-CM
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
## 2019-04-11 LAB — RPR: RPR Ser Ql: NONREACTIVE

## 2019-04-11 LAB — CERVICOVAGINAL ANCILLARY ONLY
Bacterial Vaginitis (gardnerella): POSITIVE — AB
Candida Glabrata: NEGATIVE
Candida Vaginitis: NEGATIVE
Chlamydia: NEGATIVE
Comment: NEGATIVE
Comment: NEGATIVE
Comment: NEGATIVE
Comment: NEGATIVE
Comment: NEGATIVE
Comment: NORMAL
Neisseria Gonorrhea: NEGATIVE
Trichomonas: NEGATIVE

## 2019-04-11 LAB — HIV ANTIBODY (ROUTINE TESTING W REFLEX): HIV Screen 4th Generation wRfx: NONREACTIVE

## 2019-04-11 LAB — HEPATITIS B SURFACE ANTIGEN: Hepatitis B Surface Ag: NEGATIVE

## 2019-04-11 LAB — HEPATITIS C ANTIBODY: Hep C Virus Ab: <0.1 s/co ratio (ref 0.0–0.9)

## 2019-04-14 LAB — CYTOLOGY - PAP
Comment: NEGATIVE
Comment: NEGATIVE
Diagnosis: HIGH — AB
HPV 16: NEGATIVE
HPV 18 / 45: NEGATIVE
High risk HPV: POSITIVE — AB

## 2019-04-15 ENCOUNTER — Telehealth: Payer: Self-pay | Admitting: General Practice

## 2019-04-15 MED ORDER — TINIDAZOLE 500 MG PO TABS: 2.0000 g | ORAL_TABLET | Freq: Every day | ORAL | 0 refills | Status: DC

## 2019-04-15 NOTE — Telephone Encounter (Signed)
Left message on VM in regards to appointment scheduled with Dr. Elly Modena at [facility] on 05/09/2019 at 9:00am.  Asked patient to give our office a call with any questions or concerns.

## 2019-04-15 NOTE — Addendum Note (Signed)
Addended by: Lajean Manes on: 04/15/2019 11:51 AM   Modules accepted: Orders

## 2019-04-16 ENCOUNTER — Telehealth: Payer: Self-pay | Admitting: *Deleted

## 2019-04-16 ENCOUNTER — Other Ambulatory Visit: Payer: Self-pay | Admitting: Certified Nurse Midwife

## 2019-04-16 DIAGNOSIS — B9689 Other specified bacterial agents as the cause of diseases classified elsewhere: Secondary | ICD-10-CM

## 2019-04-16 DIAGNOSIS — N760 Acute vaginitis: Secondary | ICD-10-CM

## 2019-04-16 MED ORDER — METRONIDAZOLE 500 MG PO TABS: 500.0000 mg | ORAL_TABLET | Freq: Two times a day (BID) | ORAL | 0 refills | Status: DC

## 2019-04-16 NOTE — Telephone Encounter (Signed)
Received fax from [facility] stating Tinidazole 500 mg is not covered by Medicaid. Medication require a prior authorization to be completed. Derl Barrow, RN

## 2019-05-09 ENCOUNTER — Encounter: Payer: Self-pay | Admitting: Obstetrics and Gynecology

## 2019-05-09 ENCOUNTER — Other Ambulatory Visit: Payer: Self-pay

## 2019-05-09 ENCOUNTER — Other Ambulatory Visit (HOSPITAL_COMMUNITY)
Admission: RE | Admit: 2019-05-09 | Discharge: 2019-05-09 | Disposition: A | Discharge status: Home / Self Care | Payer: Medicaid Other | Source: Ambulatory Visit | Attending: Obstetrics and Gynecology | Admitting: Obstetrics and Gynecology

## 2019-05-09 ENCOUNTER — Ambulatory Visit: Payer: Medicaid Other | Admitting: Obstetrics and Gynecology

## 2019-05-09 VITALS — BP 103/70 | HR 90 | Wt 192.4 lb

## 2019-05-09 DIAGNOSIS — N871 Moderate cervical dysplasia: Secondary | ICD-10-CM

## 2019-05-09 DIAGNOSIS — R87613 High grade squamous intraepithelial lesion on cytologic smear of cervix (HGSIL): Secondary | ICD-10-CM | POA: Insufficient documentation

## 2019-05-09 DIAGNOSIS — Z3202 Encounter for pregnancy test, result negative: Secondary | ICD-10-CM | POA: Diagnosis not present

## 2019-05-09 LAB — POCT URINE PREGNANCY: Preg Test, Ur: NEGATIVE

## 2019-05-09 NOTE — Progress Notes (Signed)
Pt is here for colposcopy. 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

## 2019-05-09 NOTE — Patient Instructions (Signed)
Loop Electrosurgical Excision Procedure Loop electrosurgical excision procedure (LEEP) is the cutting and removal (excision) of tissue from the cervix. The cervix is the bottom part of the uterus that opens into the vagina. The tissue that is removed from the cervix is examined to see if there are precancerous cells or cancer cells present. LEEP may be done when:  You have abnormal bleeding from your cervix.  You have an abnormal Pap test result.  Your health care provider finds an abnormality on your cervix during a pelvic exam. LEEP typically only takes a few minutes and is often done in the health care provider's office. The procedure is safe for women who are trying to get pregnant. However, the procedure is usually not done during a menstrual period or during pregnancy. Tell a health care provider about:  Any allergies you have.  All medicines you are taking, including vitamins, herbs, eye drops, creams, and over-the-counter medicines.  Any blood disorders you have.  Any medical conditions you have, including current or past vaginal infections such as herpes or sexually-transmitted infections (STIs).  Whether you are pregnant or may be pregnant.  Whether or not you are having vaginal bleeding on the day of the procedure. What are the risks? Generally, this is a safe procedure. However, problems may occur, including:  Infection.  Bleeding.  Allergic reactions to medicines.  Changes or scarring in the cervix.  Increased risk of early (preterm) labor in future pregnancies. What happens before the procedure?  Ask your health care provider about: ? Changing or stopping your regular medicines. This is especially important if you are taking diabetes medicines or blood thinners. ? Taking medicines such as aspirin and ibuprofen. These medicines can thin your blood. Do not take these medicines unless your health care provider tells you to take them. ? Taking over-the-counter  medicines, vitamins, herbs, and supplements.  Your health care provider may recommend that you take pain medicine before the procedure.  Ask your health care provider if you should plan to have someone take you home after the procedure. What happens during the procedure?   An instrument called a speculum will be placed in your vagina. This will allow your health care provider to see your cervix.  You will be given a medicine to numb the area (local anesthetic). The medicine will be injected into your cervix and the surrounding area.  A solution will be applied to your cervix. This solution will help the health care provider find the abnormal cells that need to be removed.  A thin wire loop will be passed through your vagina. The wire will be used to burn (cauterize) the cervical tissue with an electrical current.  You may feel faint during the procedure. Tell your health care provider right away if you feel this way.  The abnormal cervical tissue will be removed.  Any open blood vessels will be cauterized to prevent bleeding.  A paste may be applied to the cauterized area of your cervix to help prevent bleeding.  The sample of cervical tissue will be examined under a microscope. The procedure may vary among health care providers and hospitals. What can I expect after the procedure? After the procedure, it is common to have:  Mild abdominal cramps that are similar to menstrual cramps. These may last for up to 1 week.  A small amount of pink-tinged or bloody vaginal discharge, including light to moderate bleeding, for 1-2 weeks.  A dark-colored discharge coming from your vagina. This is from   the paste that was used on the cervix to prevent bleeding. It is up to you to get the results of your procedure. Ask your health care provider, or the department that is doing the procedure, when your results will be ready. Follow these instructions at home:  Take over-the-counter and  prescription medicines only as told by your health care provider.  Return to your normal activities as told by your health care provider. Ask your health care provider what activities are safe for you.  Do not put anything in your vagina for 2 weeks after the procedure or until your health care provider says that it is okay. This includes tampons, creams, and douches.  Do not have sex until your health care provider approves.  Keep all follow-up visits as told by your health care provider. This is important. Contact a health care provider if you:  Have a fever or chills.  Feel unusually weak.  Have vaginal bleeding that is heavier or longer than a normal menstrual cycle. A sign of this can be soaking a pad with blood or bleeding with clots.  Develop a bad smelling vaginal discharge.  Have severe abdominal pain or cramping. Summary  Loop electrosurgical excision procedure (LEEP) is the removal of tissue from the cervix. The removed tissue will be checked for precancerous cells or cancer cells.  LEEP typically only takes a few minutes and is often done in the health care provider's office.  Do not put anything in your vagina for 2 weeks after the procedure or until your health care provider says that it is okay. This includes tampons, creams, and douches.  Keep all follow-up visits as told by your health care provider. Ask your health care provider, or the department that is doing the procedure, when your results will be ready. This information is not intended to replace advice given to you by your health care provider. Make sure you discuss any questions you have with your health care provider. Document Revised: 05/18/2018 Document Reviewed: 05/18/2018 Elsevier Patient Education  2020 Elsevier Inc.  

## 2019-05-09 NOTE — Progress Notes (Signed)
Patient given informed consent, signed copy in the chart, time out was performed.  Placed in lithotomy position. Cervix viewed with speculum and colposcope after application of acetic acid.   Colposcopy adequate?  yes Acetowhite lesions? Yes 2, 5 and 7 o'clock Punctation? no Mosaicism?  no Abnormal vasculature?  no Biopsies? Yes 2, 5 and 7 o'clock ECC? yes  COMMENTS:  Patient was given post procedure instructions.  She will return in 2 weeks for LEEP pending results.  Mora Bellman, MD

## 2019-05-13 LAB — SURGICAL PATHOLOGY

## 2019-05-27 ENCOUNTER — Encounter: Payer: Medicaid Other | Admitting: Obstetrics and Gynecology

## 2019-06-17 ENCOUNTER — Other Ambulatory Visit: Payer: Self-pay

## 2019-06-17 ENCOUNTER — Ambulatory Visit (INDEPENDENT_AMBULATORY_CARE_PROVIDER_SITE_OTHER): Payer: Medicaid Other | Admitting: Obstetrics and Gynecology

## 2019-06-17 ENCOUNTER — Other Ambulatory Visit (HOSPITAL_COMMUNITY)
Admission: RE | Admit: 2019-06-17 | Discharge: 2019-06-17 | Disposition: A | Discharge status: Home / Self Care | Payer: Medicaid Other | Source: Ambulatory Visit | Attending: Obstetrics and Gynecology | Admitting: Obstetrics and Gynecology

## 2019-06-17 ENCOUNTER — Encounter: Payer: Self-pay | Admitting: Obstetrics and Gynecology

## 2019-06-17 VITALS — BP 123/80 | HR 86 | Wt 200.0 lb

## 2019-06-17 DIAGNOSIS — N871 Moderate cervical dysplasia: Secondary | ICD-10-CM

## 2019-06-17 NOTE — Progress Notes (Signed)
Patient identified, informed consent obtained, signed copy in chart, time out performed.  Pap smear and colposcopy reviewed.   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  Cervix visualized. Paracervical block placed.  A medium size LOOP used to remove cone of cervix using blend of cut and cautery on LEEP machine.  Edges/Base cauterized with Ball.  Monsel's solution used for hemostasis.  Patient tolerated procedure well.  Patient given post procedure instructions.  Follow up in 12 months for repeat pap or as neede pending results.

## 2019-06-19 LAB — SURGICAL PATHOLOGY

## 2020-03-06 ENCOUNTER — Encounter: Payer: Self-pay | Admitting: General Practice

## 2020-07-30 ENCOUNTER — Other Ambulatory Visit: Payer: Self-pay

## 2020-07-30 ENCOUNTER — Ambulatory Visit (INDEPENDENT_AMBULATORY_CARE_PROVIDER_SITE_OTHER): Payer: Medicaid Other

## 2020-07-30 VITALS — BP 112/78 | HR 72

## 2020-07-30 DIAGNOSIS — Z3200 Encounter for pregnancy test, result unknown: Secondary | ICD-10-CM

## 2020-07-30 LAB — POCT PREGNANCY, URINE

## 2020-07-30 NOTE — Progress Notes (Signed)
Ms. Penza presents today for UPT. She does not have any pregnancy complaints .LMP: 06/2020    OBJECTIVE: Appears well, in no apparent distress.  OB History    Gravida  3   Para      Term      Preterm      AB      Living  2     SAB      IAB      Ectopic      Multiple      Live Births  2          Home UPT Result:neg In-Office UPT result:neg I have reviewed the patient's medical, obstetrical, social, and family histories, and medications.   ASSESSMENT: Negative pregnancy test  PLAN Follow up prn

## 2020-10-12 ENCOUNTER — Other Ambulatory Visit: Payer: Self-pay

## 2020-10-12 ENCOUNTER — Ambulatory Visit (INDEPENDENT_AMBULATORY_CARE_PROVIDER_SITE_OTHER): Payer: Medicaid Other | Admitting: Primary Care

## 2020-10-12 ENCOUNTER — Encounter (INDEPENDENT_AMBULATORY_CARE_PROVIDER_SITE_OTHER): Payer: Self-pay | Admitting: Primary Care

## 2020-10-12 VITALS — BP 108/75 | HR 71 | Temp 97.5°F | Ht 63.0 in | Wt 211.0 lb

## 2020-10-12 DIAGNOSIS — N926 Irregular menstruation, unspecified: Secondary | ICD-10-CM

## 2020-10-12 DIAGNOSIS — Z13220 Encounter for screening for lipoid disorders: Secondary | ICD-10-CM

## 2020-10-12 DIAGNOSIS — Z7689 Persons encountering health services in other specified circumstances: Secondary | ICD-10-CM

## 2020-10-12 NOTE — Progress Notes (Signed)
New Patient Office Visit  Subjective:  Patient ID: Vanessa Stevens, female    DOB: Aug 08, 1980  Age: 40 y.o. MRN: [id number]  CC:  Chief Complaint  Patient presents with  . New Patient (Initial Visit)    HPI Vanessa Stevens is a obese female  presents for establish care no concerns or problems. PCP stated only problem at this time morbid obesity which can cause co morbidities and discussed them.  No past medical history on file.  Past Surgical History:  Procedure Laterality Date  . BREAST SURGERY    . KNEE ARTHROSCOPY WITH ANTERIOR CRUCIATE LIGAMENT (ACL) REPAIR    . OOPHORECTOMY      No family history on file.  Social History   Socioeconomic History  . Marital status: Single    Spouse name: Not on file  . Number of children: Not on file  . Years of education: Not on file  . Highest education level: Associate degree: academic program  Occupational History  . Not on file  Tobacco Use  . Smoking status: Former Smoker    Packs/day: 0.50    Types: Cigarettes  . Smokeless tobacco: Never Used  Vaping Use  . Vaping Use: Never used  Substance and Sexual Activity  . Alcohol use: No  . Drug use: No  . Sexual activity: Yes    Birth control/protection: None  Other Topics Concern  . Not on file  Social History Narrative  . Not on file   Social Determinants of Health   Financial Resource Strain: Not on file  Food Insecurity: Not on file  Transportation Needs: Not on file  Physical Activity: Not on file  Stress: Not on file  Social Connections: Not on file  Intimate Partner Violence: Not on file    ROS Review of Systems  All other systems reviewed and are negative.   Objective:   Today's Vitals: BP 108/75 (BP Location: Right Arm, Patient Position: Sitting, Cuff Size: Large)   Pulse 71   Temp (!) 97.5 F (36.4 C) (Temporal)   Ht 5' 3"  (1.6 m)   Wt 211 lb (95.7 kg)   LMP 09/17/2020 (Approximate)   SpO2 94%   BMI 37.38 kg/m   Physical  Exam Vitals reviewed.  Constitutional:      Appearance: She is obese.  HENT:     Head: Normocephalic.     Right Ear: Tympanic membrane and external ear normal.     Left Ear: Tympanic membrane and external ear normal.     Nose: Nose normal.  Eyes:     Extraocular Movements: Extraocular movements intact.     Pupils: Pupils are equal, round, and reactive to light.  Cardiovascular:     Rate and Rhythm: Normal rate and regular rhythm.  Pulmonary:     Effort: Pulmonary effort is normal.     Breath sounds: Normal breath sounds.  Abdominal:     General: Abdomen is flat. Bowel sounds are normal. There is distension.     Palpations: Abdomen is soft.  Musculoskeletal:        General: Normal range of motion.     Cervical back: Normal range of motion and neck supple.  Skin:    General: Skin is warm and dry.  Neurological:     Mental Status: She is alert and oriented to person, place, and time.  Psychiatric:        Mood and Affect: Mood normal.        Behavior: Behavior normal.  Thought Content: Thought content normal.     Assessment & Plan:  [name] was seen today for new patient (initial visit).  Diagnoses and all orders for this visit:  Encounter to establish care Establish care with new provider   Obesity (Paia Obesity is 30-39 indicating an excess in caloric intake or underlining conditions. This may lead to other co-morbidities. Lifestyle modifications of diet and exercise may reduce obesity. (cvd, htn, dm and respiratory complication. On AVS provide weight loss plan  -     CMP14+EGFR -     TSH + free T4  Irregular menstrual cycle -     CBC with Differential  Screening for lipid disorders -     Lipid Panel     Outpatient Encounter Medications as of 10/12/2020  Medication Sig  . [DISCONTINUED] metroNIDAZOLE (FLAGYL) 500 MG tablet Take 1 tablet (500 mg total) by mouth 2 (two) times daily. (Patient not taking: Reported on 05/09/2019)   No facility-administered  encounter medications on file as of 10/12/2020.    Follow-up: No follow-ups on file.   Kerin Perna, NP

## 2020-10-13 LAB — CMP14+EGFR
ALT: 4 IU/L (ref 0–32)
AST: 6 IU/L (ref 0–40)
Albumin/Globulin Ratio: 1.4 (ref 1.2–2.2)
Albumin: 4.2 g/dL (ref 3.8–4.8)
Alkaline Phosphatase: 86 IU/L (ref 44–121)
BUN/Creatinine Ratio: 17 (ref 9–23)
BUN: 12 mg/dL (ref 6–20)
Bilirubin Total: 0.4 mg/dL (ref 0.0–1.2)
CO2: 20 mmol/L (ref 20–29)
Calcium: 8.8 mg/dL (ref 8.7–10.2)
Chloride: 107 mmol/L — ABNORMAL HIGH (ref 96–106)
Creatinine, Ser: 0.69 mg/dL (ref 0.57–1.00)
Globulin, Total: 2.9 g/dL (ref 1.5–4.5)
Glucose: 96 mg/dL (ref 65–99)
Potassium: 4.3 mmol/L (ref 3.5–5.2)
Sodium: 140 mmol/L (ref 134–144)
Total Protein: 7.1 g/dL (ref 6.0–8.5)
eGFR: 113 mL/min/{1.73_m2} (ref >59)

## 2020-10-13 LAB — CBC WITH DIFFERENTIAL/PLATELET
Basophils Absolute: 0 10*3/uL (ref 0.0–0.2)
Basos: 0 %
EOS (ABSOLUTE): 0.3 10*3/uL (ref 0.0–0.4)
Eos: 5 %
Hematocrit: 38.3 % (ref 34.0–46.6)
Hemoglobin: 12.7 g/dL (ref 11.1–15.9)
Immature Grans (Abs): 0 10*3/uL (ref 0.0–0.1)
Immature Granulocytes: 0 %
Lymphocytes Absolute: 1.9 10*3/uL (ref 0.7–3.1)
Lymphs: 34 %
MCH: 31.4 pg (ref 26.6–33.0)
MCHC: 33.2 g/dL (ref 31.5–35.7)
MCV: 95 fL (ref 79–97)
Monocytes Absolute: 0.4 10*3/uL (ref 0.1–0.9)
Monocytes: 7 %
Neutrophils Absolute: 3.1 10*3/uL (ref 1.4–7.0)
Neutrophils: 54 %
Platelets: 267 10*3/uL (ref 150–450)
RBC: 4.04 x10E6/uL (ref 3.77–5.28)
RDW: 13.3 % (ref 11.7–15.4)
WBC: 5.7 10*3/uL (ref 3.4–10.8)

## 2020-10-13 LAB — LIPID PANEL
Chol/HDL Ratio: 3.7 ratio (ref 0.0–4.4)
Cholesterol, Total: 217 mg/dL — ABNORMAL HIGH (ref 100–199)
HDL: 59 mg/dL (ref >39)
LDL Chol Calc (NIH): 149 mg/dL — ABNORMAL HIGH (ref 0–99)
Triglycerides: 54 mg/dL (ref 0–149)
VLDL Cholesterol Cal: 9 mg/dL (ref 5–40)

## 2020-10-13 LAB — TSH+FREE T4
Free T4: 1.21 ng/dL (ref 0.82–1.77)
TSH: 1.62 u[IU]/mL (ref 0.450–4.500)

## 2020-12-07 DIAGNOSIS — H5213 Myopia, bilateral: Secondary | ICD-10-CM | POA: Diagnosis not present

## 2024-01-29 ENCOUNTER — Ambulatory Visit: Payer: Self-pay

## 2024-02-09 ENCOUNTER — Ambulatory Visit: Payer: Self-pay

## 2024-02-09 VITALS — BP 111/79 | HR 69 | Ht 63.0 in | Wt 202.6 lb

## 2024-02-09 DIAGNOSIS — Z309 Encounter for contraceptive management, unspecified: Secondary | ICD-10-CM

## 2024-02-09 DIAGNOSIS — Z1231 Encounter for screening mammogram for malignant neoplasm of breast: Secondary | ICD-10-CM

## 2024-02-09 DIAGNOSIS — Z124 Encounter for screening for malignant neoplasm of cervix: Secondary | ICD-10-CM

## 2024-02-09 DIAGNOSIS — B9689 Other specified bacterial agents as the cause of diseases classified elsewhere: Secondary | ICD-10-CM

## 2024-02-09 DIAGNOSIS — A599 Trichomoniasis, unspecified: Secondary | ICD-10-CM

## 2024-02-09 DIAGNOSIS — Z113 Encounter for screening for infections with a predominantly sexual mode of transmission: Secondary | ICD-10-CM

## 2024-02-09 DIAGNOSIS — Z131 Encounter for screening for diabetes mellitus: Secondary | ICD-10-CM

## 2024-02-09 DIAGNOSIS — Z3009 Encounter for other general counseling and advice on contraception: Secondary | ICD-10-CM

## 2024-02-09 LAB — WET PREP FOR TRICH, YEAST, CLUE
Clue Cell Exam: POSITIVE — AB
Trichomonas Exam: POSITIVE — AB
Yeast Exam: NEGATIVE

## 2024-02-09 LAB — HM HIV SCREENING LAB: HM HIV Screening: NEGATIVE

## 2024-02-09 MED ORDER — METRONIDAZOLE 500 MG PO TABS: 500.0000 mg | ORAL_TABLET | Freq: Two times a day (BID) | ORAL | Status: AC

## 2024-02-09 NOTE — Progress Notes (Signed)
 Pt is here for PE. Wet prep results reviewed with patient. The patient was dispensed metronidazole  500 mg tablets 2x/day for 7 days Opportunity given to pt to ask questions for any clarifications. Contact card declined and Brochure given.

## 2024-02-09 NOTE — Progress Notes (Signed)
 [facility] [address], [address] [place] [address] Main phone: [phone]  Family Planning Visit - Initial Visit  Subjective:  Vanessa Stevens is a 43 y.o.  G3P0   being seen today for an initial annual visit and to discuss reproductive life planning.  The patient is currently using abstinence for pregnancy prevention. Patient does not want a pregnancy in the next year. She does not want contraception today.  Patient has the following medical conditions: There are no active problems to display for this patient.  Chief Complaint  Patient presents with   Annual Exam    PE/Pap/STI   HPI Patient reports vaginal discharge before/after her period. Has noticed a strong vaginal odor. No pelvic pain. 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 No follow up since then. Feb 2021 had conization. Repeat pap today. Recently began a vegan diet, exercising more and trying to focus on her health. Not sexually active and has not been in >1 year.  Review of Systems  All other systems reviewed and are negative.  Diabetes screening This patient is 43 y.o. with a BMI of Body mass index is 35.89 kg/m.Vanessa Stevens  Is patient eligible for diabetes screening (age >35 and BMI >25)?  yes  Was Hgb A1c ordered? yes  STI screening Patient reports 0 of partners in last year.  Does this patient desire STI screening?  Yes  Hepatitis C screening Has patient been screened once for HCV in the past?  Yes  No results found for: HCVAB  Does the patient meet criteria for HCV testing? No  (If yes-- Screen for HCV through [facility]) Criteria:  Since the last HCV result, does the patient have any of the following? - Current drug use - Have a partner with drug use - Has been incarcerated  Hepatitis B screening Does the patient meet criteria for HBV testing? 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 CERVIX, CONIZATION: - High-grade squamous intraep...   05/09/2019 Surgical pathology( [place]/ [facility]) SURGICAL PATHOLOGY: SURGICAL PATHOLOGY CASE: [id number] PATIENT: Vanessa Stevens Surgical Pathology Report     Clinical History: HGSIL (cm)     FINAL MICROSCOPIC DIAGNOSIS:  A. ENDOCERVIX, CURETTAGE: - Benign endocervical mucosa.  B. CERVIX, 2, 5 AND 7 ...   04/10/2019 Cytology - PAP( [place]) High risk HPV: Positive (A) HPV 16: Negative HPV 18 / 45: Negative Adequacy: Satisfactory for evaluation; transformation zone component PRESENT. Diagnosis: - High grade squamous intraepithelial lesion (HSIL) (A) Comment: There are a few cells suggestive of a higher grade lesion. Clinical Comment: correlation is recommended. Comment: Normal Reference Range HPV - Negative Comment: Normal Reference Range HPV 16 18 45 -Negative     Health Maintenance Due  Topic Date Due   DTaP/Tdap/Td (1 - Tdap) Never done   Hepatitis B Vaccines 19-59 Average Risk (1 of 3 - 19+ 3-dose series) Never done   HPV VACCINES (1 - 3-dose SCDM series) Never done   Mammogram  Never done   Influenza Vaccine  Never done   COVID-19 Vaccine (1 - 2024-25 season)  Never done   Cervical Cancer Screening (HPV/Pap Cotest)  04/09/2024    The following portions of the patient's history were reviewed and updated as appropriate: allergies, current medications, past family history, past medical history, past social history, past surgical history and problem list. Problem list updated.  See flowsheet for further details and programmatic  requirements Hyperlink available at the top of the signed note in blue.  Flow sheet content below:  Pregnancy Intention Screening Does the patient want to become pregnant in the next year?: No Does the patient's partner want to become pregnant in the next year?: No Would the patient like to discuss contraceptive options today?: No Other:  Password: results Sexual History What age did you start your period?: 14 How often do you have your period?: monthly/regular Date of last sex?:  (a year ago) Has the patient had unprotected sex within the last 5 days?: No Do you have sex with men, women, both men and women?: Men only In the past 2 months how many partners have you had sex with?: 0 In the past 12 months, how many partners have you had sex with?: 0 Is it possible that any of your sex partners in the past 12 months had sex with someone else whild they were still in a sexual relationship with you?: No What ways do you have sex?: Vaginal Do you or your partner use condoms and/or dental dams every time you have vaginal, oral or anal sex?: Yes Do you douche?: No Date of last HIV test?: 04/28/19 Have you ever had an STD?: Yes Have any of your partners had an STD?: Yes Partner Previous STD?: Chlamydia Date?:  (long ago in pt 20's) Have you or your partner ever shot up drugs?: No Have any of your partners used drugs in the past?: No Have you or your partners exchanged money or drugs for sex?: No Risk Factors for Hep B Household, sexual, or needle sharing contact of a person infected with Hep B: No Sexual contact with a person who uses drugs not as prescribed?: No Currently or Ever used drugs not as prescribed: No HIV Positive: No PRep Patient: No Men who have sex with men: No Have Hepatitis C: No History of Incarceration: No History of Homeslessness?: No Anal sex following anal drug use?: No Risk Factors for Hep C Currently using drugs not as prescribed: No Sexual partner(s)  currently using drugs as not prescribed: No History of drug use: No HIV Positive: No People with a history of incarceration: No People born between the years of 32 and 45: No  Objective:   Vitals:   02/09/24 1022  BP: 111/79  Pulse: 69  Weight: 202 lb 9.6 oz (91.9 kg)  Height: 5' 3 (1.6 m)   Physical Exam Vitals and nursing note reviewed. Exam conducted with a chaperone present Brett Orange).  Constitutional:      Appearance: Normal appearance.  HENT:     Head: Normocephalic and atraumatic.     Mouth/Throat:     Mouth: Mucous membranes are moist.     Pharynx: Oropharynx is clear. No oropharyngeal exudate or posterior oropharyngeal erythema.  Pulmonary:     Effort: Pulmonary effort is normal.  Abdominal:     General: Abdomen is flat.     Palpations: There is no mass.     Tenderness: There is no abdominal tenderness. There is no rebound.  Genitourinary:    General: Normal vulva.     Exam position: Lithotomy position.  Pubic Area: No rash or pubic lice.      Labia:        Right: No rash or lesion.        Left: No rash or lesion.      Vagina: Vaginal discharge present. No erythema, bleeding or lesions.     Cervix: Erythema present. No cervical motion tenderness, discharge, friability or lesion.     Comments: pH = <4.5 Cervical petechiae  Lymphadenopathy:     Head:     Right side of head: No preauricular or posterior auricular adenopathy.     Left side of head: No preauricular or posterior auricular adenopathy.     Cervical: No cervical adenopathy.     Upper Body:     Right upper body: No supraclavicular, axillary or epitrochlear adenopathy.     Left upper body: No supraclavicular, axillary or epitrochlear adenopathy.     Lower Body: No right inguinal adenopathy. No left inguinal adenopathy.  Skin:    General: Skin is warm and dry.     Findings: No rash.  Neurological:     Mental Status: She is alert and oriented to person, place, and time.    Assessment and  Plan:  Vanessa Stevens is a 43 y.o. female presenting to the [facility] Department for an initial annual wellness/contraceptive visit  1. Family planning (Primary)  Contraception counseling:  Reviewed options based on patient desire and reproductive life plan. Patient not sexually active and denies need for contraception.   Risks, benefits, and typical effectiveness rates were reviewed.  Questions were answered.  Written information was also given to the patient to review.    The patient will follow up in  1 years for surveillance.  The patient was told to call with any further questions, or with any concerns about this method of contraception.  Emphasized use of condoms 100% of the time for STI prevention.  Emergency Contraception Precautions (ECP): Patient assessed for need of ECP. She is not a candidate based on not sexually active.  2. Screening for venereal disease  - Chlamydia/Gonorrhea [facility] - WET PREP FOR TRICH, YEAST, CLUE - HIV [facility] - Syphilis Serology,  Lab  3. Screening for cervical cancer  - IGP, Aptima HPV  4. Encounter for screening mammogram for malignant neoplasm of breast  - Referred to [facility], provided pamphlet and sent referral to [facility].  5. Screening for diabetes mellitus  - Hgb A1c w/o eAG   6. Trichomonas infection  - metroNIDAZOLE  (FLAGYL ) 500 MG tablet; Take 1 tablet (500 mg total) by mouth 2 (two) times daily for 7 days.  7. Bacterial vaginosis  - metroNIDAZOLE  (FLAGYL ) 500 MG tablet; Take 1 tablet (500 mg total) by mouth 2 (two) times daily for 7 days.   Return in about 3 months (around 05/11/2024).  No future appointments.  Damien FORBES Satchel, NP

## 2024-02-10 LAB — HGB A1C W/O EAG: Hgb A1c MFr Bld: 5.8 % — ABNORMAL HIGH (ref 4.8–5.6)

## 2024-02-12 ENCOUNTER — Telehealth: Payer: Self-pay

## 2024-02-13 ENCOUNTER — Ambulatory Visit: Payer: Self-pay

## 2024-02-13 LAB — IGP, APTIMA HPV
HPV Aptima: NEGATIVE
PAP Smear Comment: 0

## 2024-02-13 NOTE — Progress Notes (Signed)
 Pap is negative/NILM with negative HPV. She has a hx of a cervical conization/CIN2. She needs 2 more normal pap tests to return to regular screening. She should return in 1 year for her pap. HgbA1c in pre-diabetic range. Message sent to patient.

## 2024-02-14 NOTE — Progress Notes (Signed)
 Added to pap reminder list

## 2024-02-16 ENCOUNTER — Other Ambulatory Visit (HOSPITAL_COMMUNITY): Payer: Self-pay | Admitting: Obstetrics and Gynecology

## 2024-02-16 DIAGNOSIS — Z139 Encounter for screening, unspecified: Secondary | ICD-10-CM

## 2024-02-19 ENCOUNTER — Ambulatory Visit (HOSPITAL_COMMUNITY)
Admission: RE | Admit: 2024-02-19 | Discharge: 2024-02-19 | Disposition: A | Discharge status: Home / Self Care | Payer: Self-pay | Source: Ambulatory Visit | Attending: Obstetrics and Gynecology | Admitting: Obstetrics and Gynecology

## 2024-02-19 ENCOUNTER — Encounter (HOSPITAL_COMMUNITY): Payer: Self-pay

## 2024-02-19 ENCOUNTER — Inpatient Hospital Stay: Payer: Self-pay | Attending: Obstetrics and Gynecology | Admitting: Oncology

## 2024-02-19 VITALS — BP 126/79 | Wt 200.1 lb

## 2024-02-19 DIAGNOSIS — Z1239 Encounter for other screening for malignant neoplasm of breast: Secondary | ICD-10-CM

## 2024-02-19 DIAGNOSIS — Z139 Encounter for screening, unspecified: Secondary | ICD-10-CM | POA: Insufficient documentation

## 2024-02-19 NOTE — Progress Notes (Signed)
 Ms. CRESENCIA ASMUS is a 43 y.o. female who presents to [facility] clinic today with no complaints.    Pap Smear: Pap not smear completed today. Last Pap smear was 02/29/24 at clinic and was normal. Per patient has history of an abnormal Pap smear back in 2021. Last Pap smear result is available in Epic.   Physical exam: Breasts Breasts symmetrical. No skin abnormalities bilateral breasts. No nipple retraction bilateral breasts. No nipple discharge bilateral breasts. No lymphadenopathy. No lumps palpated bilateral breasts. No complaints.   No results found.   Pelvic/Bimanual Pap is not indicated today.    Smoking History: Patient has is a former smoker .   Patient Navigation: Patient education provided. Access to services provided for patient through [facility] program.  Colorectal Cancer Screening: Per patient has never had colonoscopy completed No complaints today.    Breast and Cervical Cancer Risk Assessment: Patient does not have family history of breast cancer, known genetic mutations, or radiation treatment to the chest before age 43. Patient does not have history of cervical dysplasia, immunocompromised, or DES exposure in-utero.  Risk Scores as of Encounter on 02/19/2024     Alisa           5-year 0.69%   Lifetime 9.5%            Last calculated by Rogerio Tempie SQUIBB, LPN on 89/86/7974 at 10:17 AM        A: [facility] exam without pap smear  P: Referred patient to the  [facility]: for a screening mammogram. Appointment scheduled 02/19/24 at 11 am.  Geofm Delon BRAVO, NP 02/19/2024 10:36 AM
